# Patient Record
Sex: Male | Born: 1950 | Race: Black or African American | Hispanic: No | Marital: Married | Smoking: Current every day smoker
Health system: Southern US, Community
[De-identification: ages and names within clinical notes are randomized; demographics above are authoritative.]

## PROBLEM LIST (undated history)

## (undated) DIAGNOSIS — I639 Cerebral infarction, unspecified: Secondary | ICD-10-CM

## (undated) DIAGNOSIS — I10 Essential (primary) hypertension: Secondary | ICD-10-CM

## (undated) DIAGNOSIS — J449 Chronic obstructive pulmonary disease, unspecified: Secondary | ICD-10-CM

---

## 2015-04-09 ENCOUNTER — Encounter (HOSPITAL_BASED_OUTPATIENT_CLINIC_OR_DEPARTMENT_OTHER): Payer: Self-pay

## 2015-04-09 ENCOUNTER — Emergency Department (HOSPITAL_BASED_OUTPATIENT_CLINIC_OR_DEPARTMENT_OTHER)
Admission: EM | Admit: 2015-04-09 | Discharge: 2015-04-09 | Disposition: A | Discharge status: Home / Self Care | Payer: BLUE CROSS/BLUE SHIELD | Attending: Emergency Medicine | Admitting: Emergency Medicine

## 2015-04-09 ENCOUNTER — Emergency Department (HOSPITAL_BASED_OUTPATIENT_CLINIC_OR_DEPARTMENT_OTHER): Payer: BLUE CROSS/BLUE SHIELD

## 2015-04-09 DIAGNOSIS — Z79899 Other long term (current) drug therapy: Secondary | ICD-10-CM | POA: Diagnosis not present

## 2015-04-09 DIAGNOSIS — J441 Chronic obstructive pulmonary disease with (acute) exacerbation: Secondary | ICD-10-CM

## 2015-04-09 DIAGNOSIS — I10 Essential (primary) hypertension: Secondary | ICD-10-CM | POA: Insufficient documentation

## 2015-04-09 DIAGNOSIS — R000 Tachycardia, unspecified: Secondary | ICD-10-CM | POA: Insufficient documentation

## 2015-04-09 DIAGNOSIS — Z8673 Personal history of transient ischemic attack (TIA), and cerebral infarction without residual deficits: Secondary | ICD-10-CM | POA: Insufficient documentation

## 2015-04-09 DIAGNOSIS — R05 Cough: Secondary | ICD-10-CM | POA: Diagnosis present

## 2015-04-09 DIAGNOSIS — Z720 Tobacco use: Secondary | ICD-10-CM | POA: Insufficient documentation

## 2015-04-09 HISTORY — DX: Cerebral infarction, unspecified: I63.9

## 2015-04-09 HISTORY — DX: Chronic obstructive pulmonary disease, unspecified: J44.9

## 2015-04-09 HISTORY — DX: Essential (primary) hypertension: I10

## 2015-04-09 MED ORDER — PREDNISONE 50 MG PO TABS
60.0000 mg | ORAL_TABLET | Freq: Once | ORAL | Status: AC
  Administered 2015-04-09: 60 mg via ORAL
  Filled 2015-04-09 (×2): qty 1

## 2015-04-09 MED ORDER — PREDNISONE 20 MG PO TABS: 40.0000 mg | ORAL_TABLET | Freq: Every day | ORAL | Status: AC

## 2015-04-09 MED ORDER — ALBUTEROL SULFATE (2.5 MG/3ML) 0.083% IN NEBU
5.0000 mg | INHALATION_SOLUTION | Freq: Once | RESPIRATORY_TRACT | Status: AC
  Administered 2015-04-09: 5 mg via RESPIRATORY_TRACT
  Filled 2015-04-09: qty 6

## 2015-04-09 MED ORDER — ALBUTEROL SULFATE HFA 108 (90 BASE) MCG/ACT IN AERS
1.0000 | INHALATION_SPRAY | RESPIRATORY_TRACT | Status: DC
  Administered 2015-04-09: 1 via RESPIRATORY_TRACT
  Filled 2015-04-09: qty 6.7

## 2015-04-09 NOTE — Discharge Instructions (Signed)
As discussed, it is important that you follow up as soon as possible with your physician for continued management of your condition.  Recall that for the next 3 days it is very important that you use the provided albuterol inhaler every 4 hours. Subsequently, you may use as needed.   If you develop any new, or concerning changes in your condition, please return to the emergency department immediately

## 2015-04-09 NOTE — ED Provider Notes (Signed)
CSN: [id number]     Arrival date & time 04/09/15  1449 History  This chart was scribed for Christopher Munch, MD by [place] Peace, ED Scribe. The patient was seen in [facility]. The patient's care was started at 3:48 PM.    Chief Complaint  Patient presents with  . Cough      The history is provided by the patient and the spouse. No language interpreter was used.    HPI Comments: [name] is a 64 y.o. male who presents to the Emergency Department complaining of productive cough (sputum: yellow) x 2 weeks with chest congestion, fever, and SOB.  No complaints of chest pain, abdominal pain, nausea, or vomiting. He further denies LOC, dizziness, or headache. Pt notes he has tried taking Alka-seltzer and some other OTC medication without relief. History of hypertension, COPD, and stroke. Pt is current everyday smoker.    Past Medical History  Diagnosis Date  . COPD (chronic obstructive pulmonary disease)   . Hypertension   . Stroke    History reviewed. No pertinent past surgical history. No family history on file. History  Substance Use Topics  . Smoking status: Current Every Day Smoker -- 0.50 packs/day    Types: Cigarettes  . Smokeless tobacco: Not on file  . Alcohol Use: Not on file    Review of Systems  Constitutional: Positive for fever.       Per HPI, otherwise negative  HENT: Positive for congestion.        Per HPI, otherwise negative  Respiratory: Positive for cough and shortness of breath.        Per HPI, otherwise negative  Cardiovascular: Negative for chest pain.       Per HPI, otherwise negative  Gastrointestinal: Negative for nausea, vomiting and abdominal pain.  Endocrine:       Negative aside from HPI  Genitourinary:       Neg aside from HPI   Musculoskeletal:       Per HPI, otherwise negative  Skin: Negative.   Neurological: Negative for dizziness, syncope and headaches.      Allergies  Review of patient's allergies indicates no known  allergies.  Home Medications   Prior to Admission medications   Medication Sig Start Date End Date Taking? Authorizing Provider  albuterol (PROVENTIL HFA;VENTOLIN HFA) 108 (90 BASE) MCG/ACT inhaler Inhale into the lungs every 6 (six) hours as needed for wheezing or shortness of breath.   Yes Historical Provider, MD  amLODipine-benazepril (LOTREL) 10-20 MG per capsule Take 1 capsule by mouth daily.   Yes Historical Provider, MD   BP 159/100 mmHg  Pulse 103  Temp(Src) 98.8 F (37.1 C) (Oral)  Resp 20  Ht  (1.727 m)  Wt 183 lb (83.008 kg)  BMI 27.83 kg/m2  SpO2 92% Physical Exam  Constitutional: He is oriented to person, place, and time. He appears well-developed. No distress.  HENT:  Head: Normocephalic and atraumatic.  Eyes: Conjunctivae and EOM are normal.  Cardiovascular: Regular rhythm.  Tachycardia present.   Pulmonary/Chest: Effort normal. No stridor. No respiratory distress. He has decreased breath sounds. He has rhonchi.  Abdominal: He exhibits no distension.  Musculoskeletal: He exhibits no edema.  Neurological: He is alert and oriented to person, place, and time.  Skin: Skin is warm and dry.  Psychiatric: He has a normal mood and affect.  Nursing note and vitals reviewed.   ED Course  Procedures (including critical care time)  Imaging Review Dg Chest 2 View  04/09/2015  CLINICAL DATA:  Cough for 2 weeks  EXAM: CHEST  2 VIEW  COMPARISON:  None.  FINDINGS: Normal heart size. Mildly tortuous aorta. Hyperaeration. Central bronchitic changes with interstitial prominence. No definite consolidation or mass. No pneumothorax or pleural effusion.  IMPRESSION: Stigmata of COPD.  No active cardiopulmonary disease.   Electronically Signed   By: Jolaine ClickArthur  Hoss M.D.   On: 04/09/2015 15:32    3:51 PM- Treatment plan was discussed with patient who verbalizes understanding and agrees.    I reviewed the results (including imaging as performed), agree with the interpretation  On  repeat exam the patient appears better.  We reviewed all findings.   Smoking cessation provided, particularly in light of this patient's evaluation in the ED.  MDM    I personally performed the services described in this documentation, which was scribed in my presence. The recorded information has been reviewed and is accurate.  Patient presents with ongoing cough, dyspnea. Patient is a smoker, with COPD. Patient is not hypoxic after breathing treatment, substantially improved, with diminished tachycardia. No fever, x-ray evidence for pneumonia. Patient started course steroids, broken dilator, discharged in stable condition to follow-up with primary care.    Christopher Munchobert Lissie Hinesley, MD 04/09/15 [phone]

## 2015-04-09 NOTE — ED Notes (Signed)
Patient here with 2 weeks of productive cough with yellow sputum. Reports that he has COPD but still smoking, no distress

## 2016-08-06 IMAGING — DX DG CHEST 2V
2 series · 2 of 2 positions shown · non-contrast
Comparison: None.

CLINICAL DATA: Cough for 2 weeks

EXAM:
CHEST  2 VIEW

[chest pa]
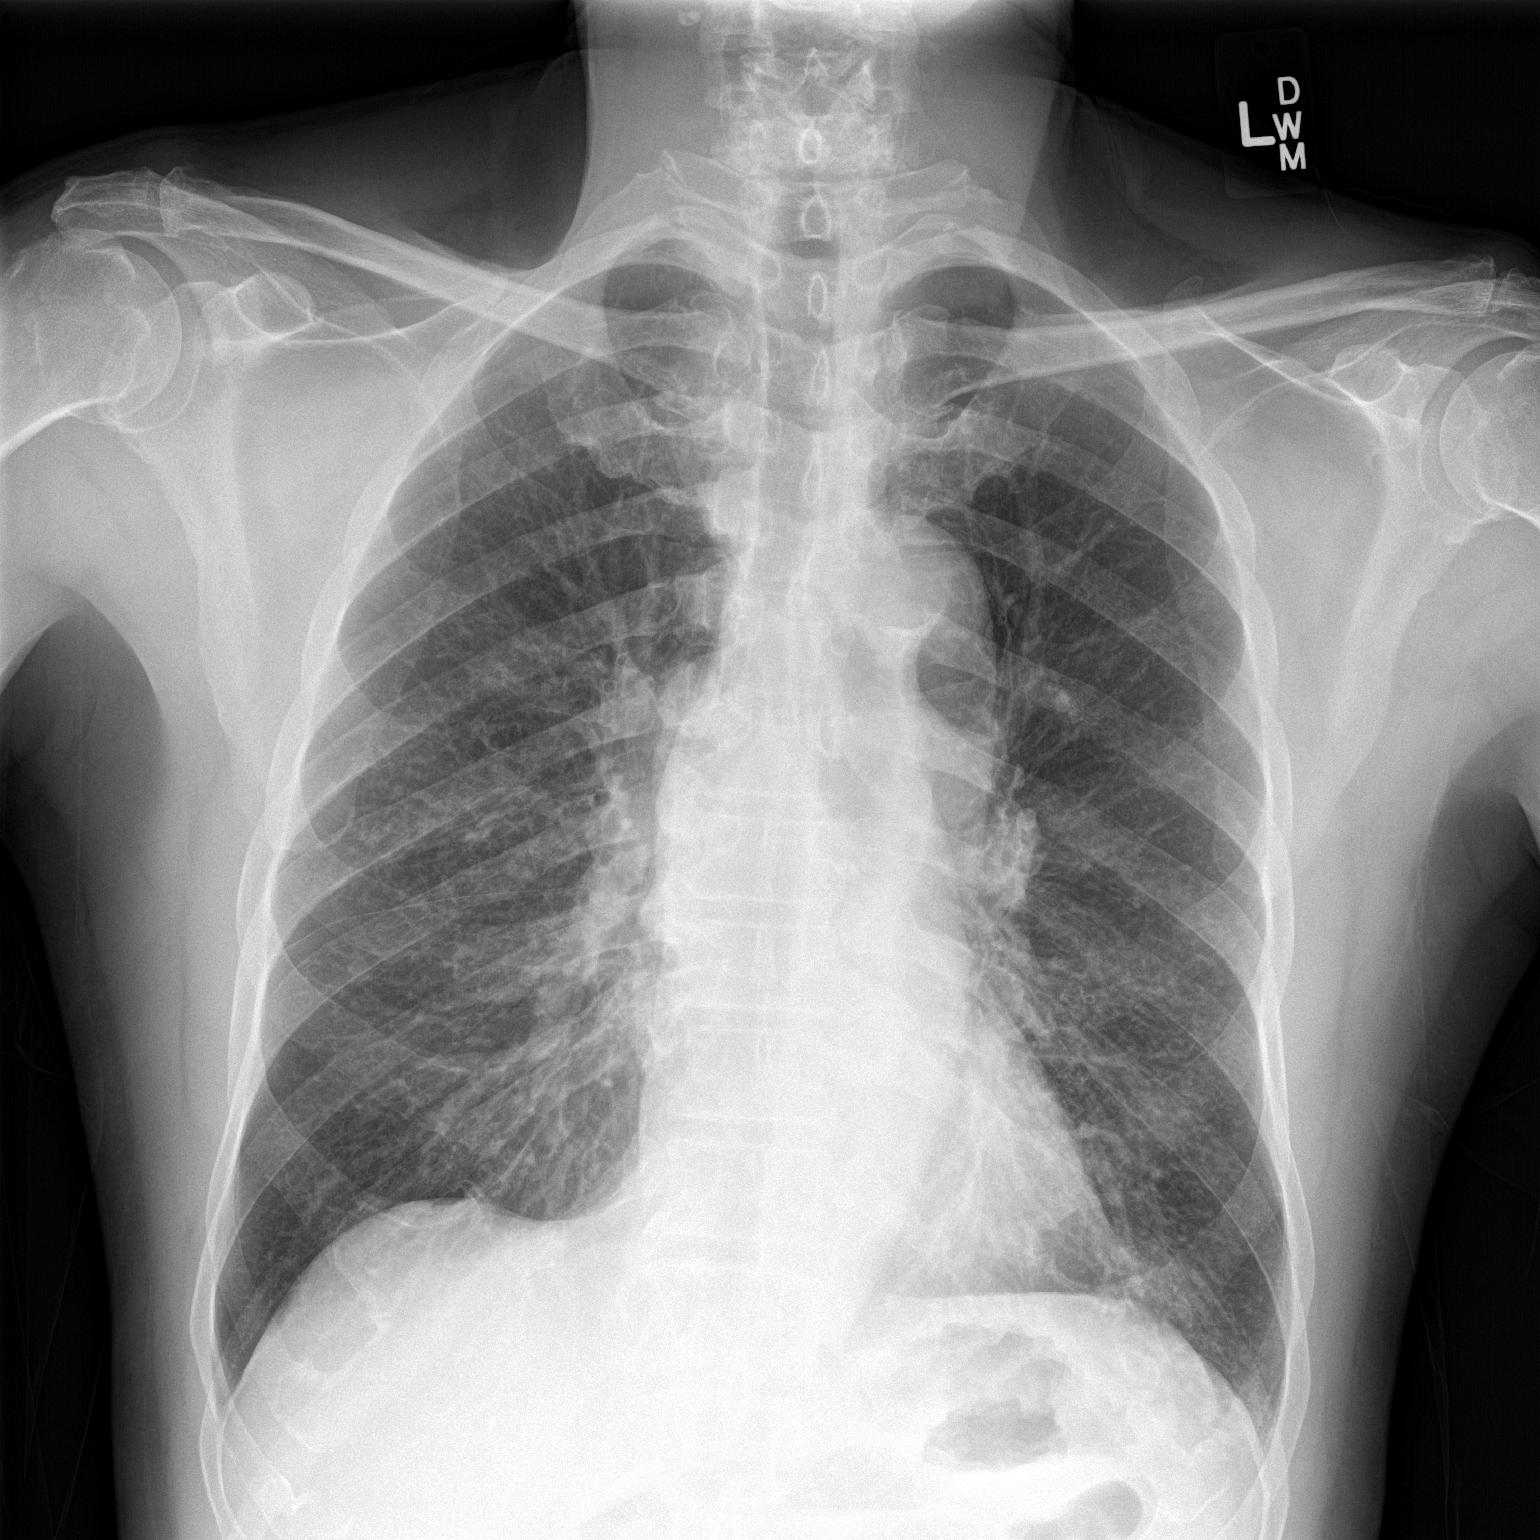

[chest lat]
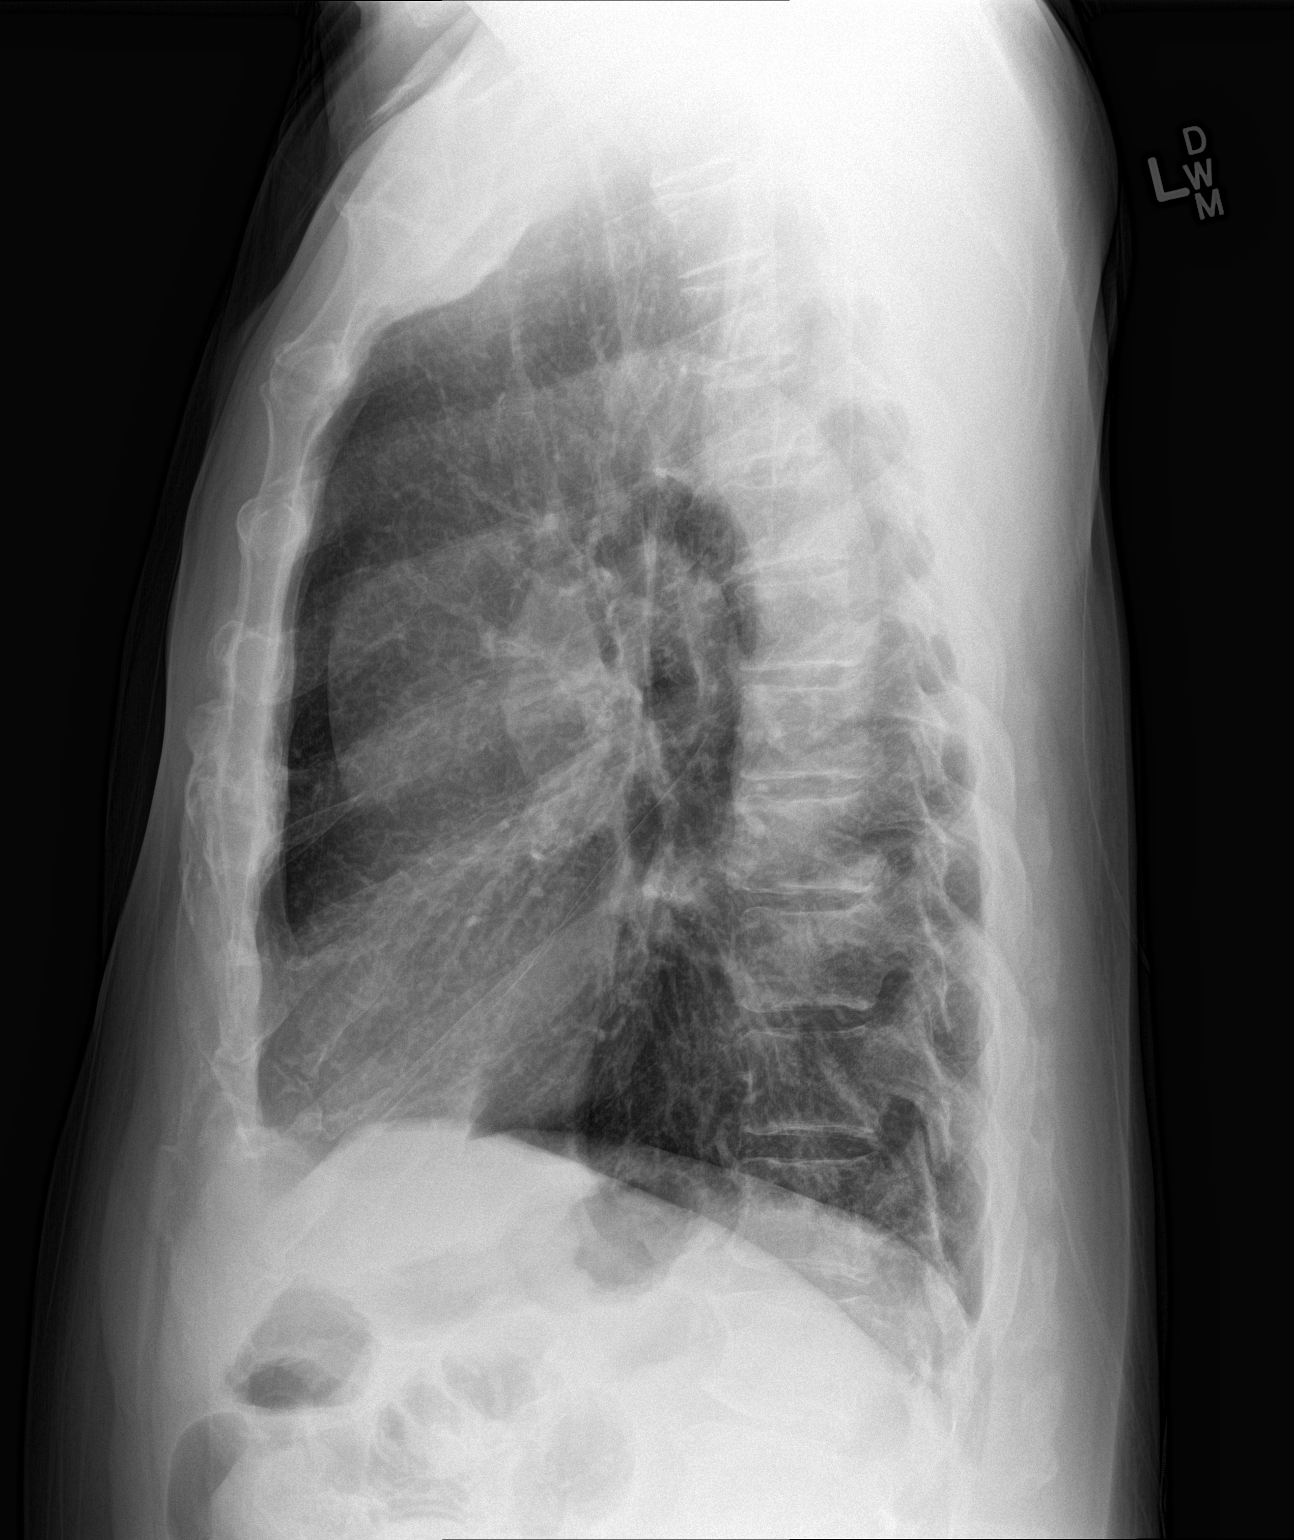

[2 of 2 positions shown; findings below may reference images not displayed]

FINDINGS: Normal heart size. Mildly tortuous aorta. Hyperaeration. Central
bronchitic changes with interstitial prominence. No definite
consolidation or mass. No pneumothorax or pleural effusion.
IMPRESSION: Stigmata of COPD.  No active cardiopulmonary disease.

## 2023-12-18 DEATH — deceased
